# Patient Record
Sex: Female | Born: 1970 | Hispanic: Yes | Marital: Married | Smoking: Never smoker
Health system: Southern US, Community
[De-identification: ages and names within clinical notes are randomized; demographics above are authoritative.]

## PROBLEM LIST (undated history)

## (undated) DIAGNOSIS — M1990 Unspecified osteoarthritis, unspecified site: Secondary | ICD-10-CM

## (undated) DIAGNOSIS — E119 Type 2 diabetes mellitus without complications: Secondary | ICD-10-CM

## (undated) HISTORY — DX: Unspecified osteoarthritis, unspecified site: M19.90

## (undated) HISTORY — DX: Type 2 diabetes mellitus without complications: E11.9

---

## 2016-10-12 ENCOUNTER — Ambulatory Visit: Payer: Self-pay | Attending: Oncology

## 2016-10-12 ENCOUNTER — Ambulatory Visit
Admission: RE | Admit: 2016-10-12 | Discharge: 2016-10-12 | Disposition: A | Discharge status: Home / Self Care | Payer: Self-pay | Source: Ambulatory Visit | Attending: Oncology | Admitting: Oncology

## 2016-10-12 VITALS — BP 134/89 | HR 85 | Temp 98.2°F | Ht 61.0 in | Wt 166.0 lb

## 2016-10-12 DIAGNOSIS — Z0000 Encounter for general adult medical examination without abnormal findings: Secondary | ICD-10-CM

## 2016-10-12 NOTE — Progress Notes (Signed)
Subjective:     Patient ID: Harlon Flor, female   DOB: 27-Jul-1970, 46 y.o.   MRN: [id number]  HPI   Review of Systems     Objective:   Physical Exam  Pulmonary/Chest: Right breast exhibits no inverted nipple, no mass, no nipple discharge, no skin change and no tenderness. Left breast exhibits no inverted nipple, no mass, no nipple discharge, no skin change and no tenderness. Breasts are symmetrical.       Assessment:     46 year old hispanic patient presents for [facility] clinic visit.  Patient screened, and meets BCCCP eligibility.  Patient does not have insurance, Medicare or Medicaid.  Handout given on Affordable Care Act.  Instructed patient on breast self-exam using teach back method. CBE unremarkable.  No mass or lump palpated.  Maritza Afanador interpreted exam.  Patient's birthday 12-28-2070.      Plan:     Sent for bilateral screening mammogram.

## 2016-10-18 NOTE — Progress Notes (Signed)
Letter mailed from [facility] to notify of normal mammogram results.  Patient to return in one year for annual screening.  Copy to [facility]. 

## 2020-03-03 ENCOUNTER — Other Ambulatory Visit: Payer: Self-pay

## 2020-03-03 ENCOUNTER — Emergency Department
Admission: EM | Admit: 2020-03-03 | Discharge: 2020-03-03 | Disposition: A | Discharge status: Home / Self Care | Payer: Self-pay | Attending: Student in an Organized Health Care Education/Training Program | Admitting: Student in an Organized Health Care Education/Training Program

## 2020-03-03 ENCOUNTER — Emergency Department: Payer: Self-pay

## 2020-03-03 DIAGNOSIS — Z20822 Contact with and (suspected) exposure to covid-19: Secondary | ICD-10-CM | POA: Insufficient documentation

## 2020-03-03 DIAGNOSIS — R519 Headache, unspecified: Secondary | ICD-10-CM | POA: Insufficient documentation

## 2020-03-03 DIAGNOSIS — N3000 Acute cystitis without hematuria: Secondary | ICD-10-CM | POA: Insufficient documentation

## 2020-03-03 DIAGNOSIS — R531 Weakness: Secondary | ICD-10-CM | POA: Insufficient documentation

## 2020-03-03 LAB — URINALYSIS, COMPLETE (UACMP) WITH MICROSCOPIC
Bilirubin Urine: NEGATIVE
Glucose, UA: NEGATIVE mg/dL
Ketones, ur: NEGATIVE mg/dL
Nitrite: POSITIVE — AB
Protein, ur: NEGATIVE mg/dL
Specific Gravity, Urine: 1.009 (ref 1.005–1.030)
WBC, UA: >50 WBC/hpf — ABNORMAL HIGH (ref 0–5)
pH: 7 (ref 5.0–8.0)

## 2020-03-03 LAB — BASIC METABOLIC PANEL
Anion gap: 11 (ref 5–15)
BUN: 14 mg/dL (ref 6–20)
CO2: 23 mmol/L (ref 22–32)
Calcium: 9.3 mg/dL (ref 8.9–10.3)
Chloride: 103 mmol/L (ref 98–111)
Creatinine, Ser: 0.55 mg/dL (ref 0.44–1.00)
GFR, Estimated: >60 mL/min (ref >60)
Glucose, Bld: 126 mg/dL — ABNORMAL HIGH (ref 70–99)
Potassium: 4 mmol/L (ref 3.5–5.1)
Sodium: 137 mmol/L (ref 135–145)

## 2020-03-03 LAB — RESP PANEL BY RT-PCR (FLU A&B, COVID) ARPGX2
Influenza A by PCR: NEGATIVE
Influenza B by PCR: NEGATIVE
SARS Coronavirus 2 by RT PCR: NEGATIVE

## 2020-03-03 LAB — CBC
HCT: 33.5 % — ABNORMAL LOW (ref 36.0–46.0)
Hemoglobin: 10.6 g/dL — ABNORMAL LOW (ref 12.0–15.0)
MCH: 23.3 pg — ABNORMAL LOW (ref 26.0–34.0)
MCHC: 31.6 g/dL (ref 30.0–36.0)
MCV: 73.6 fL — ABNORMAL LOW (ref 80.0–100.0)
Platelets: 340 10*3/uL (ref 150–400)
RBC: 4.55 MIL/uL (ref 3.87–5.11)
RDW: 21.7 % — ABNORMAL HIGH (ref 11.5–15.5)
WBC: 11.1 10*3/uL — ABNORMAL HIGH (ref 4.0–10.5)
nRBC: 0 % (ref 0.0–0.2)

## 2020-03-03 LAB — POC SARS CORONAVIRUS 2 AG -  ED: SARS Coronavirus 2 Ag: NEGATIVE

## 2020-03-03 MED ORDER — DIPHENHYDRAMINE HCL 50 MG/ML IJ SOLN
12.5000 mg | Freq: Once | INTRAMUSCULAR | Status: AC
  Administered 2020-03-03: 12.5 mg via INTRAVENOUS
  Filled 2020-03-03: qty 1

## 2020-03-03 MED ORDER — SODIUM CHLORIDE 0.9 % IV SOLN
1.0000 g | Freq: Once | INTRAVENOUS | Status: AC
  Administered 2020-03-03: 1 g via INTRAVENOUS
  Filled 2020-03-03: qty 10

## 2020-03-03 MED ORDER — SODIUM CHLORIDE 0.9 % IV BOLUS
1000.0000 mL | Freq: Once | INTRAVENOUS | Status: AC
  Administered 2020-03-03: 1000 mL via INTRAVENOUS

## 2020-03-03 MED ORDER — KETOROLAC TROMETHAMINE 30 MG/ML IJ SOLN
15.0000 mg | Freq: Once | INTRAMUSCULAR | Status: AC
  Administered 2020-03-03: 15 mg via INTRAVENOUS
  Filled 2020-03-03: qty 1

## 2020-03-03 MED ORDER — PROCHLORPERAZINE EDISYLATE 10 MG/2ML IJ SOLN
10.0000 mg | Freq: Once | INTRAMUSCULAR | Status: AC
  Administered 2020-03-03: 10 mg via INTRAVENOUS
  Filled 2020-03-03: qty 2

## 2020-03-03 MED ORDER — ONDANSETRON 4 MG PO TBDP: 4.0000 mg | ORAL_TABLET | Freq: Three times a day (TID) | ORAL | 0 refills | Status: AC | PRN

## 2020-03-03 MED ORDER — CEPHALEXIN 500 MG PO CAPS: 500.0000 mg | ORAL_CAPSULE | Freq: Three times a day (TID) | ORAL | 0 refills | Status: AC

## 2020-03-03 NOTE — ED Provider Notes (Signed)
[facility] Emergency Department Provider Note    Event Date/Time   First MD Initiated Contact with Patient 03/03/20 [phone]     (approximate)  I have reviewed the triage vital signs and the nursing notes.   HISTORY  Chief Complaint Weakness    HPI Cassandra Webster is a 50 y.o. female with a history of headaches presents to the ER for evaluation of generalized malaise muscle aches backache as well as headache for the past week or so.  Not having cough or congestion.  No known sick contacts.  No measured fevers.  Feels generalized weakness.  Has been taking ibuprofen without improvement.  No neck stiffness.  No numbness or tingling.  Denies any dysuria.  No history of kidney stones or kidney infection.    History reviewed. No pertinent past medical history. No family history on file. History reviewed. No pertinent surgical history. There are no problems to display for this patient.     Prior to Admission medications   Medication Sig Start Date End Date Taking? Authorizing Provider  cephALEXin (KEFLEX) 500 MG capsule Take 1 capsule (500 mg total) by mouth 3 (three) times daily for 7 days. 03/03/20 03/10/20 Yes Merlyn Lot, MD  ondansetron (ZOFRAN ODT) 4 MG disintegrating tablet Take 1 tablet (4 mg total) by mouth every 8 (eight) hours as needed for nausea or vomiting. 03/03/20  Yes Merlyn Lot, MD    Allergies Patient has no allergy information on record.    Social History    Review of Systems Patient denies headaches, rhinorrhea, blurry vision, numbness, shortness of breath, chest pain, edema, cough, abdominal pain, nausea, vomiting, diarrhea, dysuria, fevers, rashes or hallucinations unless otherwise stated above in HPI. ____________________________________________   PHYSICAL EXAM:  VITAL SIGNS: Vitals:   03/03/20 0938 03/03/20 1100  BP: 126/82 116/72  Pulse: 90 82  Resp: 19 16  Temp: 99.2 F (37.3 C)   SpO2: 100% 98%     Constitutional: Alert and oriented.  Eyes: Conjunctivae are normal.  Head: Atraumatic. Nose: No congestion/rhinnorhea. Mouth/Throat: Mucous membranes are moist.   Neck: No stridor. Painless ROM.  Cardiovascular: Normal rate, regular rhythm. Grossly normal heart sounds.  Good peripheral circulation. Respiratory: Normal respiratory effort.  No retractions. Lungs CTAB. Gastrointestinal: Soft and nontender. No distention. No abdominal bruits. No CVA tenderness. Genitourinary:  Musculoskeletal: No lower extremity tenderness nor edema.  No joint effusions. Neurologic:  CN- intact.  No facial droop, Normal FNF.  Normal heel to shin.  Sensation intact bilaterally. Normal speech and language. No gross focal neurologic deficits are appreciated. No gait instability. Skin:  Skin is warm, dry and intact. No rash noted. Psychiatric: Mood and affect are normal. Speech and behavior are normal.  ____________________________________________   LABS (all labs ordered are listed, but only abnormal results are displayed)  Results for orders placed or performed during the hospital encounter of 03/03/20 (from the past 24 hour(s))  Basic metabolic panel     Status: Abnormal   Collection Time: 03/03/20  8:53 AM  Result Value Ref Range   Sodium 137 135 - 145 mmol/L   Potassium 4.0 3.5 - 5.1 mmol/L   Chloride 103 98 - 111 mmol/L   CO2 23 22 - 32 mmol/L   Glucose, Bld 126 (H) 70 - 99 mg/dL   BUN 14 6 - 20 mg/dL   Creatinine, Ser 0.55 0.44 - 1.00 mg/dL   Calcium 9.3 8.9 - 10.3 mg/dL   GFR, Estimated >60 >60 mL/min  Anion gap 11 5 - 15  CBC     Status: Abnormal   Collection Time: 03/03/20  8:53 AM  Result Value Ref Range   WBC 11.1 (H) 4.0 - 10.5 K/uL   RBC 4.55 3.87 - 5.11 MIL/uL   Hemoglobin 10.6 (L) 12.0 - 15.0 g/dL   HCT 33.5 (L) 36.0 - 46.0 %   MCV 73.6 (L) 80.0 - 100.0 fL   MCH 23.3 (L) 26.0 - 34.0 pg   MCHC 31.6 30.0 - 36.0 g/dL   RDW 21.7 (H) 11.5 - 15.5 %   Platelets 340 150 - 400 K/uL    nRBC 0.0 0.0 - 0.2 %  Urinalysis, Complete w Microscopic Urine, Clean Catch     Status: Abnormal   Collection Time: 03/03/20  9:55 AM  Result Value Ref Range   Color, Urine YELLOW (A) YELLOW   APPearance HAZY (A) CLEAR   Specific Gravity, Urine 1.009 1.005 - 1.030   pH 7.0 5.0 - 8.0   Glucose, UA NEGATIVE NEGATIVE mg/dL   Hgb urine dipstick SMALL (A) NEGATIVE   Bilirubin Urine NEGATIVE NEGATIVE   Ketones, ur NEGATIVE NEGATIVE mg/dL   Protein, ur NEGATIVE NEGATIVE mg/dL   Nitrite POSITIVE (A) NEGATIVE   Leukocytes,Ua LARGE (A) NEGATIVE   RBC / HPF 0-5 0 - 5 RBC/hpf   WBC, UA >50 (H) 0 - 5 WBC/hpf   Bacteria, UA MANY (A) NONE SEEN   Squamous Epithelial / LPF 0-5 0 - 5  Resp Panel by RT-PCR (Flu A&B, Covid) Nasopharyngeal Swab     Status: None   Collection Time: 03/03/20  9:55 AM   Specimen: Nasopharyngeal Swab; Nasopharyngeal(NP) swabs in vial transport medium  Result Value Ref Range   SARS Coronavirus 2 by RT PCR NEGATIVE NEGATIVE   Influenza A by PCR NEGATIVE NEGATIVE   Influenza B by PCR NEGATIVE NEGATIVE  POC SARS Coronavirus 2 Ag-ED - Nasal Swab (BD Veritor Kit)     Status: None   Collection Time: 03/03/20 10:33 AM  Result Value Ref Range   SARS Coronavirus 2 Ag NEGATIVE NEGATIVE   ____________________________________________  EKG My review and personal interpretation at Time: 8:47   Indication: weakness  Rate: 90  Rhythm: sinus Axis: normal Other: normal intervals, no stemi ____________________________________________  RADIOLOGY  I personally reviewed all radiographic images ordered to evaluate for the above acute complaints and reviewed radiology reports and findings.  These findings were personally discussed with the patient.  Please see medical record for radiology report.  ____________________________________________   PROCEDURES  Procedure(s) performed:  Procedures    Critical Care performed:  no ____________________________________________   INITIAL IMPRESSION / ASSESSMENT AND PLAN / ED COURSE  Pertinent labs & imaging results that were available during my care of the patient were reviewed by me and considered in my medical decision making (see chart for details).   DDX: Viral illness, dehydration, tension headache, migraine, mass, meningeal, CVA  Cassandra Webster is a 50 y.o. who presents to the ED with presentation as described above.  Patient nontoxic-appearing afebrile hemodynamically stable.  Generalized malaise symptoms without any focal neuro deficits.  Is not sudden onset no trauma.  She has no meningeal irritation.  This not consistent with meningitis or SAH.  CT head normal.  Is not consistent with CVA.  Will give IV fluids treat for migraine and reassess.  Possible urine tract symptoms will get a chest x-ray.  Will test for Covid.  Clinical Course as of 03/03/20 1205  Tue Mar 03, 2020  1042 Urine consistent with UTI.  I do suspect she has mild pyelonephritis.  No blood to suggest stone her symptoms simply more consistent with pyelonephritis.  Will give IV Rocephin and IV fluids and reassess. [PR]  1147 Patient feeling much improved.  Is tolerating p.o. She appears appropriate for for outpatient  management and follow-up. [PR]    Clinical Course User Index [PR] Merlyn Lot, MD    The patient was evaluated in Emergency Department today for the symptoms described in the history of present illness. He/she was evaluated in the context of the global COVID-19 pandemic, which necessitated consideration that the patient might be at risk for infection with the SARS-CoV-2 virus that causes COVID-19. Institutional protocols and algorithms that pertain to the evaluation of patients at risk for COVID-19 are in a state of rapid change based on information released by regulatory bodies including the CDC and federal and state organizations. These policies and algorithms  were followed during the patient's care in the ED.  As part of my medical decision making, I reviewed the following data within the [place] notes reviewed and incorporated, Labs reviewed, notes from prior ED visits and [place] Controlled Substance Database   ____________________________________________   FINAL CLINICAL IMPRESSION(S) / ED DIAGNOSES  Final diagnoses:  Generalized weakness  Acute cystitis without hematuria      NEW MEDICATIONS STARTED DURING THIS VISIT:  New Prescriptions   CEPHALEXIN (KEFLEX) 500 MG CAPSULE    Take 1 capsule (500 mg total) by mouth 3 (three) times daily for 7 days.   ONDANSETRON (ZOFRAN ODT) 4 MG DISINTEGRATING TABLET    Take 1 tablet (4 mg total) by mouth every 8 (eight) hours as needed for nausea or vomiting.     Note:  This document was prepared using Dragon voice recognition software and may include unintentional dictation errors.    Merlyn Lot, MD 03/03/20 [phone]

## 2020-03-03 NOTE — ED Triage Notes (Addendum)
Pt comes via POV from home with c/o severe headache and increased weakness. Pt states pain to head since last Friday. Pt states she feels achy all over and hurts in her bones.  Pt states she feels so weak she can barely walk and get around.  Pt states some blurry vision from yesterday because her head hurt so much. Pt states some nausea. Pt family states in past she had a hernia or something similar in back of neck area. Family unsure if that is what is going on.

## 2020-03-28 ENCOUNTER — Encounter: Payer: Self-pay | Admitting: *Deleted

## 2020-04-22 ENCOUNTER — Ambulatory Visit
Admission: RE | Admit: 2020-04-22 | Discharge: 2020-04-22 | Disposition: A | Discharge status: Home / Self Care | Payer: Self-pay | Source: Ambulatory Visit | Attending: Oncology | Admitting: Oncology

## 2020-04-22 ENCOUNTER — Ambulatory Visit: Payer: Self-pay | Attending: Oncology

## 2020-04-22 ENCOUNTER — Other Ambulatory Visit: Payer: Self-pay

## 2020-04-22 VITALS — BP 122/85 | HR 76 | Temp 97.3°F | Ht 68.0 in | Wt 155.3 lb

## 2020-04-22 DIAGNOSIS — Z0000 Encounter for general adult medical examination without abnormal findings: Secondary | ICD-10-CM | POA: Insufficient documentation

## 2020-04-22 NOTE — Progress Notes (Signed)
  Subjective:     Patient ID: Harlon Flor, female   DOB: 05-02-1970, 50 y.o.   MRN: [id number]  HPI   Review of Systems     Objective:   Physical Exam Chest:  Breasts:     Right: No swelling, bleeding, inverted nipple, mass, nipple discharge, skin change or tenderness.     Left: No swelling, bleeding, inverted nipple, mass, nipple discharge, skin change or tenderness.          Assessment:     50 year old patient presents for [facility] clinic visit.  Patient's daughter present for exam.  Patient screened, and meets B[facility]CP eligibility.  Patient does not have insurance, Medicare or Medicaid.  Instructed patient on breast self awareness using teach back method.  Clinical breast exam unremarkable. No mass or lump palpated.   Risk Assessment    Risk Scores      04/22/2020   Last edited by: Jim Like, RN   5-year risk: 0.5 %   Lifetime risk: 4.2 %             Plan:     Sent for bilateral screening mammogram.

## 2020-05-06 NOTE — Progress Notes (Signed)
Letter mailed from [facility] to notify of normal mammogram results.  Patient to return in one year for annual screening.  Copy to [facility]. 

## 2020-05-28 ENCOUNTER — Emergency Department: Payer: Self-pay

## 2020-05-28 ENCOUNTER — Other Ambulatory Visit: Payer: Self-pay

## 2020-05-28 ENCOUNTER — Emergency Department
Admission: EM | Admit: 2020-05-28 | Discharge: 2020-05-28 | Disposition: A | Discharge status: Home / Self Care | Payer: Self-pay | Attending: Emergency Medicine | Admitting: Emergency Medicine

## 2020-05-28 DIAGNOSIS — Z28310 Unvaccinated for covid-19: Secondary | ICD-10-CM | POA: Insufficient documentation

## 2020-05-28 DIAGNOSIS — U071 COVID-19: Secondary | ICD-10-CM | POA: Insufficient documentation

## 2020-05-28 DIAGNOSIS — M791 Myalgia, unspecified site: Secondary | ICD-10-CM

## 2020-05-28 DIAGNOSIS — R5081 Fever presenting with conditions classified elsewhere: Secondary | ICD-10-CM

## 2020-05-28 LAB — URINALYSIS, COMPLETE (UACMP) WITH MICROSCOPIC
Bilirubin Urine: NEGATIVE
Glucose, UA: NEGATIVE mg/dL
Ketones, ur: NEGATIVE mg/dL
Leukocytes,Ua: NEGATIVE
Nitrite: NEGATIVE
Protein, ur: NEGATIVE mg/dL
Specific Gravity, Urine: 1.015 (ref 1.005–1.030)
pH: 6 (ref 5.0–8.0)

## 2020-05-28 LAB — BASIC METABOLIC PANEL
Anion gap: 11 (ref 5–15)
BUN: 12 mg/dL (ref 6–20)
CO2: 23 mmol/L (ref 22–32)
Calcium: 9.1 mg/dL (ref 8.9–10.3)
Chloride: 102 mmol/L (ref 98–111)
Creatinine, Ser: 0.64 mg/dL (ref 0.44–1.00)
GFR, Estimated: >60 mL/min (ref >60)
Glucose, Bld: 114 mg/dL — ABNORMAL HIGH (ref 70–99)
Potassium: 4 mmol/L (ref 3.5–5.1)
Sodium: 136 mmol/L (ref 135–145)

## 2020-05-28 LAB — CBC
HCT: 39.7 % (ref 36.0–46.0)
Hemoglobin: 13 g/dL (ref 12.0–15.0)
MCH: 27.1 pg (ref 26.0–34.0)
MCHC: 32.7 g/dL (ref 30.0–36.0)
MCV: 82.9 fL (ref 80.0–100.0)
Platelets: 305 10*3/uL (ref 150–400)
RBC: 4.79 MIL/uL (ref 3.87–5.11)
RDW: 17.8 % — ABNORMAL HIGH (ref 11.5–15.5)
WBC: 6.8 10*3/uL (ref 4.0–10.5)
nRBC: 0 % (ref 0.0–0.2)

## 2020-05-28 LAB — RESP PANEL BY RT-PCR (FLU A&B, COVID) ARPGX2
Influenza A by PCR: NEGATIVE
Influenza B by PCR: NEGATIVE
SARS Coronavirus 2 by RT PCR: POSITIVE — AB

## 2020-05-28 LAB — LACTIC ACID, PLASMA
Lactic Acid, Venous: 2.4 mmol/L (ref 0.5–1.9)
Lactic Acid, Venous: 1.4 mmol/L (ref 0.5–1.9)

## 2020-05-28 MED ORDER — LACTATED RINGERS IV BOLUS
1000.0000 mL | Freq: Once | INTRAVENOUS | Status: AC
  Administered 2020-05-28: 1000 mL via INTRAVENOUS

## 2020-05-28 MED ORDER — NIRMATRELVIR/RITONAVIR (PAXLOVID)TABLET
3.0000 | ORAL_TABLET | Freq: Once | ORAL | Status: DC
  Filled 2020-05-28: qty 30

## 2020-05-28 MED ORDER — KETOROLAC TROMETHAMINE 30 MG/ML IJ SOLN
15.0000 mg | Freq: Once | INTRAMUSCULAR | Status: AC
  Administered 2020-05-28: 15 mg via INTRAVENOUS
  Filled 2020-05-28: qty 1

## 2020-05-28 MED ORDER — ACETAMINOPHEN 325 MG PO TABS
650.0000 mg | ORAL_TABLET | Freq: Once | ORAL | Status: AC | PRN
  Administered 2020-05-28: 650 mg via ORAL
  Filled 2020-05-28: qty 2

## 2020-05-28 MED ORDER — NIRMATRELVIR/RITONAVIR (PAXLOVID)TABLET: 3.0000 | ORAL_TABLET | Freq: Two times a day (BID) | ORAL | 0 refills | Status: AC

## 2020-05-28 NOTE — ED Provider Notes (Signed)
[facility] Emergency Department Provider Note ____________________________________________   Event Date/Time   First MD Initiated Contact with Patient 05/28/20 1110     (approximate)  I have reviewed the triage vital signs and the nursing notes.  HISTORY  Chief Complaint Flank Pain   HPI Cassandra Webster is a 50 y.o. femalewho presents to the ED for evaluation of fever, myalgias.   Chart review indicates no relevant history.  Patient is to the ED, accompanied by her daughter who provides some additional history, for evaluation of 2 days of fever, myalgias bilateral atraumatic flank/back pain.  Patient reports that she is not vaccinated for COVID-19, and has had no known sick contacts.  In person Spanish interpreter utilized for history and physical.  Patient reports about 2 weeks of sore throat, that has improved, but now reports primarily 2 days of fevers and myalgias.  Denies chest pain and shortness of breath, but does report nonproductive cough and decreased p.o. intake.  Denies any sick contacts at home, but does report regularly going to [place] and the laundromat.  She reports pain all over, primarily to her bilateral hips and bilateral flanks and lower back.  Denies any falls or trauma.  Denies syncope.  Denies dysuria, diarrhea or stool changes, denies emesis.   History reviewed. No pertinent past medical history.  There are no problems to display for this patient.   History reviewed. No pertinent surgical history.  Prior to Admission medications   Medication Sig Start Date End Date Taking? Authorizing Provider  nirmatrelvir/ritonavir EUA (PAXLOVID) TABS Take 3 tablets by mouth 2 (two) times daily for 5 days. Patient GFR is >60. Take nirmatrelvir (150 mg) two tablets twice daily for 5 days and ritonavir (100 mg) one tablet twice daily for 5 days. 05/28/20 06/02/20 Yes Delton Prairie, MD  ondansetron (ZOFRAN ODT) 4 MG disintegrating  tablet Take 1 tablet (4 mg total) by mouth every 8 (eight) hours as needed for nausea or vomiting. 03/03/20   Willy Eddy, MD    Allergies Patient has no known allergies.  No family history on file.  Social History    Review of Systems  Constitutional: Positive for fever/chills Eyes: No visual changes. ENT: Positive for sore throat, resolving. Cardiovascular: Denies chest pain. Respiratory: Denies shortness of breath. Gastrointestinal: No abdominal pain.  No nausea, no vomiting.  No diarrhea.  No constipation. Genitourinary: Negative for dysuria. Musculoskeletal: Positive for myalgias Skin: Negative for rash. Neurological: Negative for  focal weakness or numbness.  ____________________________________________   PHYSICAL EXAM:  VITAL SIGNS: Vitals:   05/28/20 1341 05/28/20 1342  BP:    Pulse: 86   Resp:  18  Temp:  99.5 F (37.5 C)  SpO2: 99%     Constitutional: Alert and oriented. Well appearing and in no acute distress. Eyes: Conjunctivae are normal. PERRL. EOMI. Head: Atraumatic. Nose: No congestion/rhinnorhea. Mouth/Throat: Mucous membranes are dry.  Oropharynx non-erythematous. Uvula is midline and tonsils are 1+ bilaterally. Neck: No stridor. No cervical spine tenderness to palpation. Cardiovascular: Tachycardic rate, regular rhythm. Grossly normal heart sounds.  Good peripheral circulation. Respiratory: Normal respiratory effort.  No retractions. Lungs CTAB. Gastrointestinal: Soft , nondistended, nontender to palpation. No CVA tenderness. Musculoskeletal: No lower extremity tenderness nor edema.  No joint effusions. No signs of acute trauma. Neurologic:  Normal speech and language. No gross focal neurologic deficits are appreciated. No gait instability noted. Skin:  Skin is warm, dry and intact. No rash noted. Psychiatric: Mood and affect are  normal. Speech and behavior are normal. ____________________________________________   LABS (all labs ordered  are listed, but only abnormal results are displayed)  Labs Reviewed  RESP PANEL BY RT-PCR (FLU A&B, COVID) ARPGX2 - Abnormal; Notable for the following components:      Result Value   SARS Coronavirus 2 by RT PCR POSITIVE (*)    All other components within normal limits  URINALYSIS, COMPLETE (UACMP) WITH MICROSCOPIC - Abnormal; Notable for the following components:   Color, Urine YELLOW (*)    APPearance CLEAR (*)    Hgb urine dipstick SMALL (*)    Bacteria, UA RARE (*)    All other components within normal limits  BASIC METABOLIC PANEL - Abnormal; Notable for the following components:   Glucose, Bld 114 (*)    All other components within normal limits  CBC - Abnormal; Notable for the following components:   RDW 17.8 (*)    All other components within normal limits  LACTIC ACID, PLASMA - Abnormal; Notable for the following components:   Lactic Acid, Venous 2.4 (*)    All other components within normal limits  LACTIC ACID, PLASMA   ____________________________________________  12 Lead EKG  Sinus rhythm, rate of 83 bpm.  Normal axis and intervals.  No evidence of acute ischemia. ____________________________________________  RADIOLOGY  ED MD interpretation: 2 view CXR reviewed by me without evidence of acute cardiopulmonary pathology. CT renal study reviewed by me without evidence of ureterolithiasis or urologic obstruction  Official radiology report(s): DG Chest 2 View  Result Date: 05/28/2020 CLINICAL DATA:  Lower lobe infiltrate. EXAM: CHEST - 2 VIEW COMPARISON:  None. FINDINGS: The heart size and mediastinal contours are within normal limits. Both lungs are clear. The visualized skeletal structures are unremarkable. IMPRESSION: No active cardiopulmonary disease. Electronically Signed   By: Lupita Raider M.D.   On: 05/28/2020 11:20   CT Renal Stone Study  Result Date: 05/28/2020 CLINICAL DATA:  Acute flank pain. EXAM: CT ABDOMEN AND PELVIS WITHOUT CONTRAST TECHNIQUE:  Multidetector CT imaging of the abdomen and pelvis was performed following the standard protocol without IV contrast. COMPARISON:  None. FINDINGS: Lower chest: No acute abnormality. Hepatobiliary: No focal liver abnormality is seen. No gallstones, gallbladder wall thickening, or biliary dilatation. Pancreas: Unremarkable. No pancreatic ductal dilatation or surrounding inflammatory changes. Spleen: Normal in size without focal abnormality. Adrenals/Urinary Tract: Adrenal glands are unremarkable. Kidneys are normal, without renal calculi, focal lesion, or hydronephrosis. Bladder is unremarkable. Stomach/Bowel: Stomach is within normal limits. Appendix appears normal. No evidence of bowel wall thickening, distention, or inflammatory changes. Vascular/Lymphatic: No significant vascular findings are present. No enlarged abdominal or pelvic lymph nodes. Reproductive: Uterus and bilateral adnexa are unremarkable. Other: No abdominal wall hernia or abnormality. No abdominopelvic ascites. Musculoskeletal: No acute or significant osseous findings. IMPRESSION: No abnormality seen in the abdomen or pelvis. Electronically Signed   By: Lupita Raider M.D.   On: 05/28/2020 11:27    ____________________________________________   PROCEDURES and INTERVENTIONS  Procedure(s) performed (including Critical Care):  .1-3 Lead EKG Interpretation Performed by: Delton Prairie, MD Authorized by: Delton Prairie, MD     Interpretation: abnormal     ECG rate:  106   ECG rate assessment: tachycardic     Rhythm: sinus tachycardia     Ectopy: none     Conduction: normal   Comments:     Prior to fluids    Medications  nirmatrelvir/ritonavir EUA (PAXLOVID) TABS 3 tablet (has no administration in time range)  acetaminophen (TYLENOL) tablet 650 mg (650 mg Oral Given 05/28/20 0907)  lactated ringers bolus 1,000 mL (0 mLs Intravenous Stopped 05/28/20 1342)  lactated ringers bolus 1,000 mL (1,000 mLs Intravenous New Bag/Given  05/28/20 1354)  ketorolac (TORADOL) 30 MG/ML injection 15 mg (15 mg Intravenous Given 05/28/20 1232)    ____________________________________________   MDM / ED COURSE   Unvaccinated 50 year old woman presents to the ED with fevers and myalgias with evidence of COVID-19 and amenable to outpatient management with initiation of baclofen.  Presents febrile and tachycardic, but remains hemodynamically stable throughout.  Vitals normalized with fluid resuscitation and antipyretics.  Exam is reassuring without evidence of distress, tachypnea, neurologic or vascular deficits.  She looks well clinically, just appears uncomfortable.  Blood work shows a mild lactic acidosis, for which she received IV fluids and this resolved on recheck.  No leukocytosis, signs of renal dysfunction.  With how well she looks clinically, her clear urine, CXR and CT abdomen/pelvis, I see no evidence of sepsis or further derangements beyond COVID-19.  She looks well on recheck and we discussed outpatient management of COVID-19 with Paxil of it, and she is agreeable.  Return precautions for the ED were discussed.  Clinical Course as of 05/28/20 1415  Thu May 28, 2020  1408 Reassessed.  Patient resting comfortably with normal vital signs on room air.  Not tachypneic.  Discussed with patient and her daughter diagnosis of COVID-19.  We discussed Paxlovid, and they are agreeable with starting this.  We discussed management at home and return precautions for the ED. [DS]    Clinical Course User Index [DS] Delton Prairie, MD    ____________________________________________   FINAL CLINICAL IMPRESSION(S) / ED DIAGNOSES  Final diagnoses:  COVID-19  Fever in other diseases  Myalgia     ED Discharge Orders         Ordered    nirmatrelvir/ritonavir EUA (PAXLOVID) TABS  2 times daily        05/28/20 1411           Karol Skarzynski   Note:  This document was prepared using Dragon voice recognition software and may include  unintentional dictation errors.   Delton Prairie, MD 05/28/20 [phone]

## 2020-05-28 NOTE — ED Triage Notes (Signed)
Pt to ED for bilateral flank pain that started worsening yesterday, recently treated for kidney infection.  Able to void with no pain, reports foul smell.  Pt appears uncomfortable in triage.

## 2020-05-28 NOTE — Discharge Instructions (Addendum)
Please take Tylenol and ibuprofen/Advil for your pain.  It is safe to take them together, or to alternate them every few hours.  Take up to 1000mg  of Tylenol at a time, up to 4 times per day.  Do not take more than 4000 mg of Tylenol in 24 hours.  For ibuprofen, take 400-600 mg, 4-5 times per day.  You are being discharged with a prescription for Paxlovid, an anti-viral medicine that helps reduce COVID symptoms and hopefully get you well sooner. This prescription should be waiting for you at the [facility] pharmacy.   If you develop any worsening symptoms despite these measures, severe difficulty breathing, please return to the ED.

## 2021-04-14 ENCOUNTER — Other Ambulatory Visit: Payer: Self-pay

## 2021-04-14 DIAGNOSIS — Z1231 Encounter for screening mammogram for malignant neoplasm of breast: Secondary | ICD-10-CM

## 2021-07-14 ENCOUNTER — Ambulatory Visit
Admission: RE | Admit: 2021-07-14 | Discharge: 2021-07-14 | Disposition: A | Discharge status: Home / Self Care | Payer: Self-pay | Source: Ambulatory Visit | Attending: Obstetrics and Gynecology | Admitting: Obstetrics and Gynecology

## 2021-07-14 ENCOUNTER — Other Ambulatory Visit: Payer: Self-pay

## 2021-07-14 ENCOUNTER — Ambulatory Visit: Payer: Self-pay | Attending: Hematology and Oncology | Admitting: *Deleted

## 2021-07-14 VITALS — BP 131/88 | Wt 161.4 lb

## 2021-07-14 DIAGNOSIS — Z1231 Encounter for screening mammogram for malignant neoplasm of breast: Secondary | ICD-10-CM

## 2021-07-14 DIAGNOSIS — Z1239 Encounter for other screening for malignant neoplasm of breast: Secondary | ICD-10-CM

## 2021-07-14 NOTE — Patient Instructions (Signed)
Explained breast self awareness with Cassandra Webster. Patient did not need a Pap smear today due to last Pap smear and HPV typing was 02/18/2020. Let her know [facility] will cover Pap smears and HPV typing every 5 years unless has a history of abnormal Pap smears. Referred patient to the [facility] for a screening mammogram. Appointment scheduled Wednesday, July 14, 2021 at 1200. Patient aware of appointment and will be there. Let patient know [name] will follow up with her within the next couple weeks with results of her mammogram by letter or phone. Cassandra Webster verbalized understanding.  [name], [name], RN 11:43 AM

## 2021-07-14 NOTE — Progress Notes (Signed)
[name] is a 51 y.o. female who presents to [facility] clinic today with no complaints.    Pap Smear: Pap smear not completed today. Last Pap smear was 02/18/2020 at [facility] clinic and was normal with negative HPV. Per patient has no history of an abnormal Pap smear. Last Pap smear result is available in Epic.   Physical exam: Breasts Breasts symmetrical. No skin abnormalities bilateral breasts. No nipple retraction bilateral breasts. No nipple discharge bilateral breasts. No lymphadenopathy. No lumps palpated bilateral breasts. No complaints of pain or tenderness on exam.  MS DIGITAL SCREENING TOMO BILATERAL  Result Date: 05/01/2020 CLINICAL DATA:  Screening. EXAM: DIGITAL SCREENING BILATERAL MAMMOGRAM WITH TOMOSYNTHESIS AND CAD TECHNIQUE: Bilateral screening digital craniocaudal and mediolateral oblique mammograms were obtained. Bilateral screening digital breast tomosynthesis was performed. The images were evaluated with computer-aided detection. COMPARISON:  Previous exam(s). ACR Breast Density Category b: There are scattered areas of fibroglandular density. FINDINGS: There are no findings suspicious for malignancy. The images were evaluated with computer-aided detection. IMPRESSION: No mammographic evidence of malignancy. A result letter of this screening mammogram will be mailed directly to the patient. RECOMMENDATION: Screening mammogram in one year. (Code:SM-B-01Y) BI-RADS CATEGORY  1: Negative. Electronically Signed   By: Meda Klinefelter MD   On: 05/01/2020 16:21   MS DIGITAL SCREENING TOMO BILATERAL  Result Date: 10/13/2016 CLINICAL DATA:  Screening. EXAM: 2D DIGITAL SCREENING BILATERAL MAMMOGRAM WITH CAD AND ADJUNCT TOMO COMPARISON:  None. ACR Breast Density Category b: There are scattered areas of fibroglandular density. FINDINGS: There are no findings suspicious for malignancy. Images were processed with CAD. IMPRESSION: No mammographic evidence of malignancy. A  result letter of this screening mammogram will be mailed directly to the patient. RECOMMENDATION: Screening mammogram in one year. (Code:SM-B-01Y) BI-RADS CATEGORY  1: Negative. Electronically Signed   By: Baird Lyons M.D.   On: 10/13/2016 09:21        Pelvic/Bimanual Pap is not indicated today per BCCCP guidelines.   Smoking History: Patient has never smoked.   Patient Navigation: Patient education provided. Access to services provided for patient through [employer] program. Spanish interpreter Delos Haring from [facility] provided.   Colorectal Cancer Screening: Per patient has never had colonoscopy completed. FIT Test given to patient to complete. No complaints today.    Breast and Cervical Cancer Risk Assessment: Patient does not have family history of breast cancer, known genetic mutations, or radiation treatment to the chest before age 75. Patient does not have history of cervical dysplasia, immunocompromised, or DES exposure in-utero.  Risk Assessment     Risk Scores       07/14/2021 04/22/2020   Last edited by: Narda Rutherford, LPN Jim Like, RN   5-year risk: 0.5 % 0.5 %   Lifetime risk: 4.1 % 4.2 %            A: BCCCP exam without pap smear No complaints.  P: Referred patient to the [facility] for a screening mammogram. Appointment scheduled Wednesday, July 14, 2021 at 1200.  Priscille Heidelberg, RN 07/14/2021 11:43 AM

## 2022-02-01 IMAGING — CT CT RENAL STONE PROTOCOL
2 of 4 series · 16 of 46 positions shown, 18 images · non-contrast
Comparison: None.

CLINICAL DATA: Acute flank pain.

EXAM:
CT ABDOMEN AND PELVIS WITHOUT CONTRAST
TECHNIQUE: Multidetector CT imaging of the abdomen and pelvis was performed
following the standard protocol without IV contrast.

[Series 2: stone full standard · axial · 0.66mm/px · z∈[-546,-100]mm · 13 of 99 slices shown, 15 images]
[im 5/99  soft-tissue]
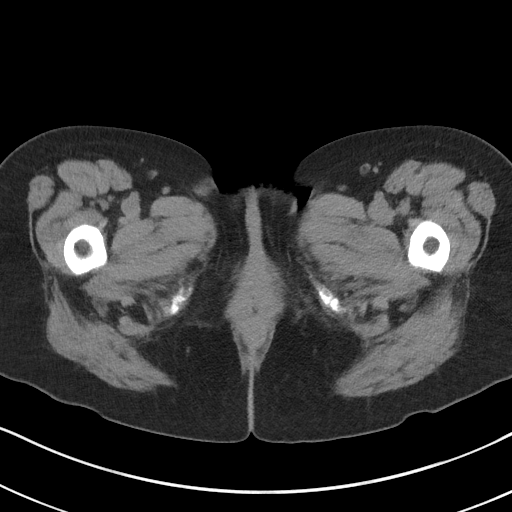
[im 5/99  bone]
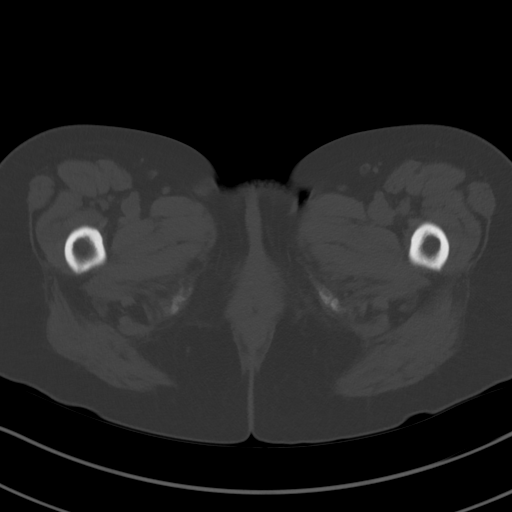
[im 13/99  soft-tissue]
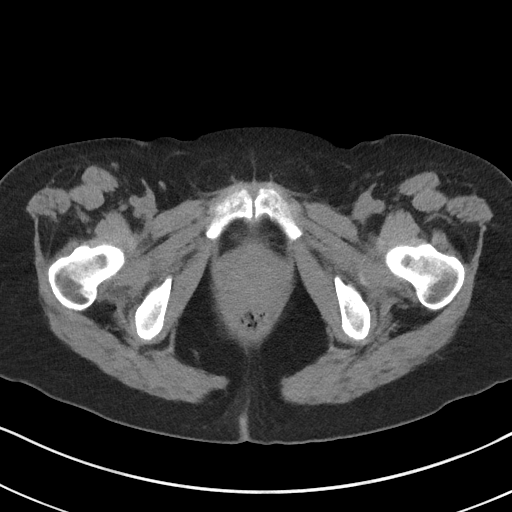
[im 22/99  soft-tissue]
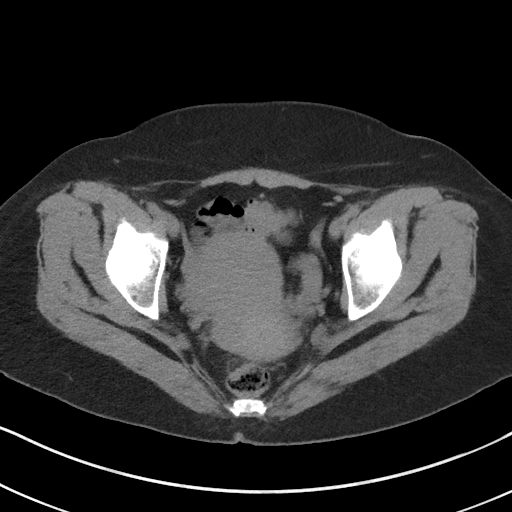
[im 26/99  soft-tissue]
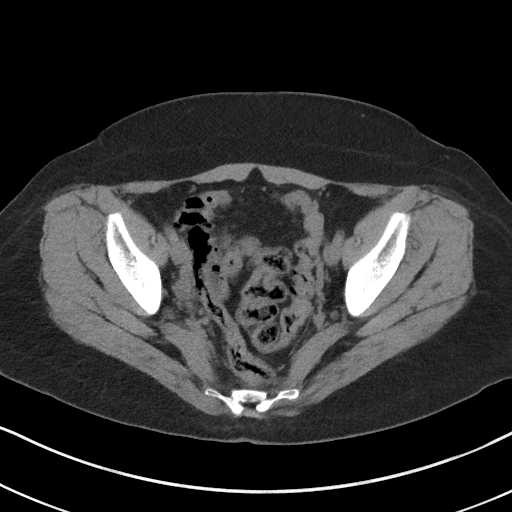
[im 35/99  soft-tissue]
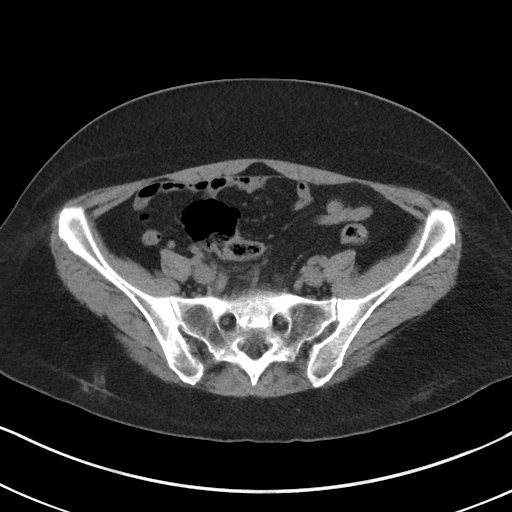
[im 43/99  soft-tissue]
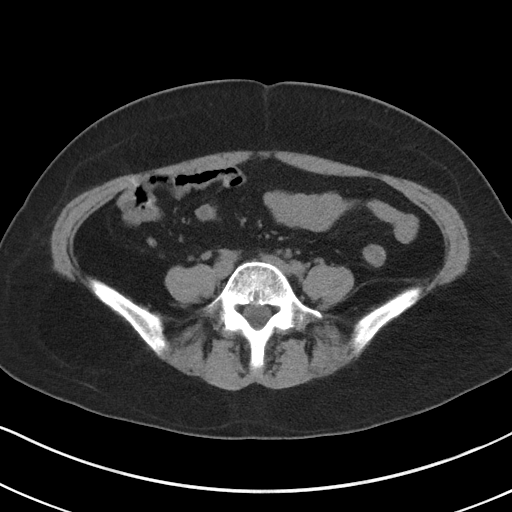
[im 52/99  soft-tissue]
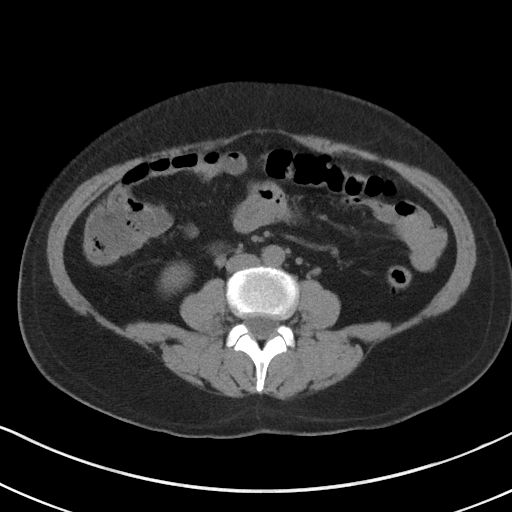
[im 56/99  soft-tissue]
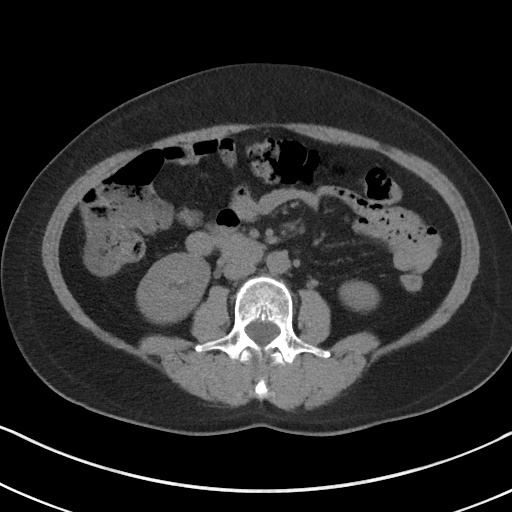
[im 64/99  soft-tissue]
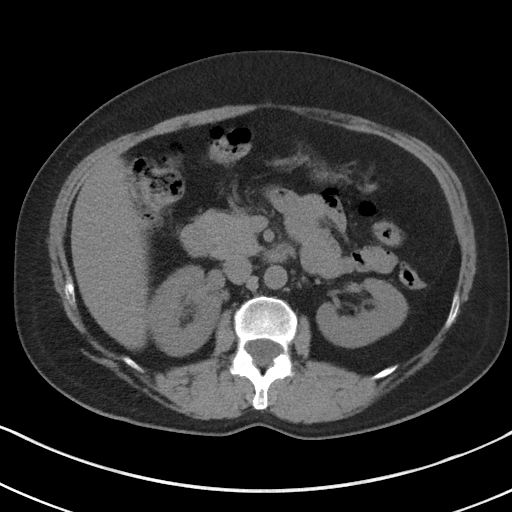
[im 64/99  bone]
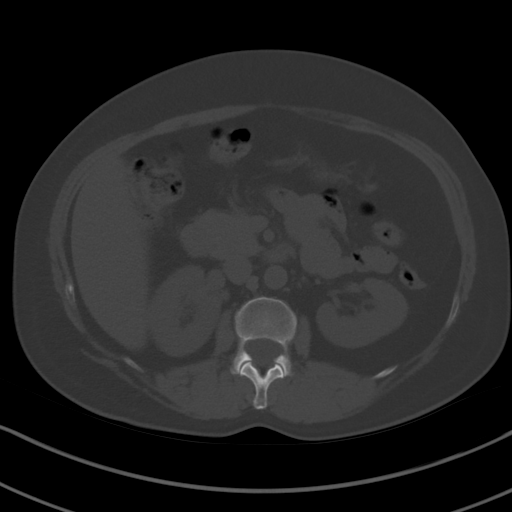
[im 73/99  soft-tissue]
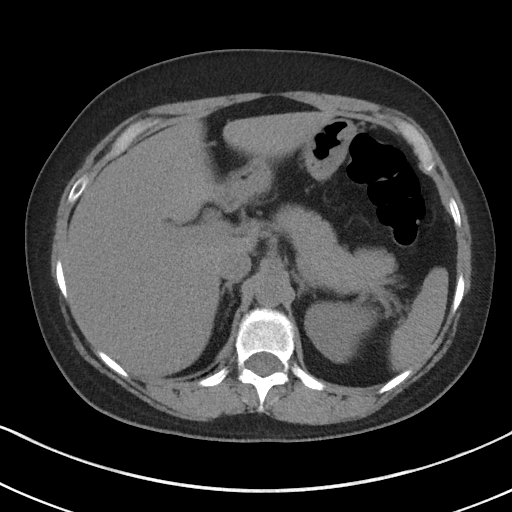
[im 77/99  soft-tissue]
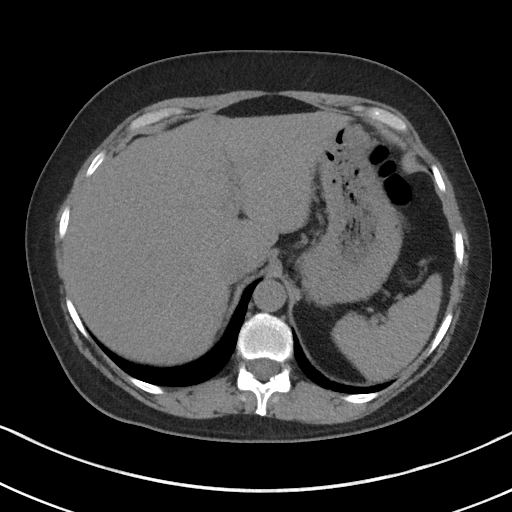
[im 86/99  soft-tissue]
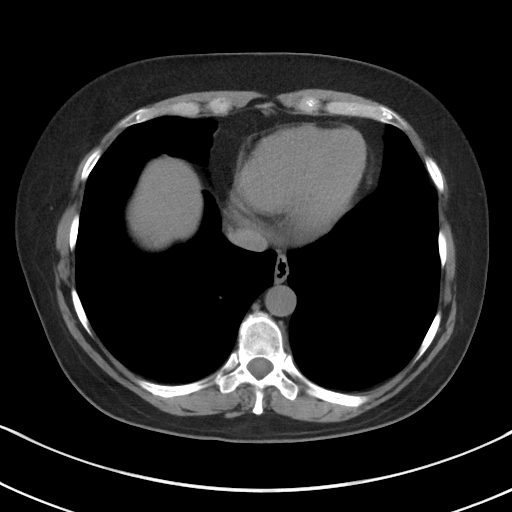
[im 94/99  soft-tissue]
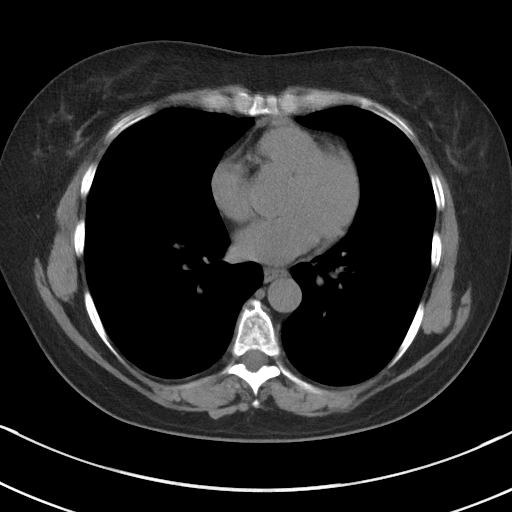

[Series 5: coronal · coronal · 0.77mm/px · 3 of 129 slices shown]
[im 43/129  soft-tissue]
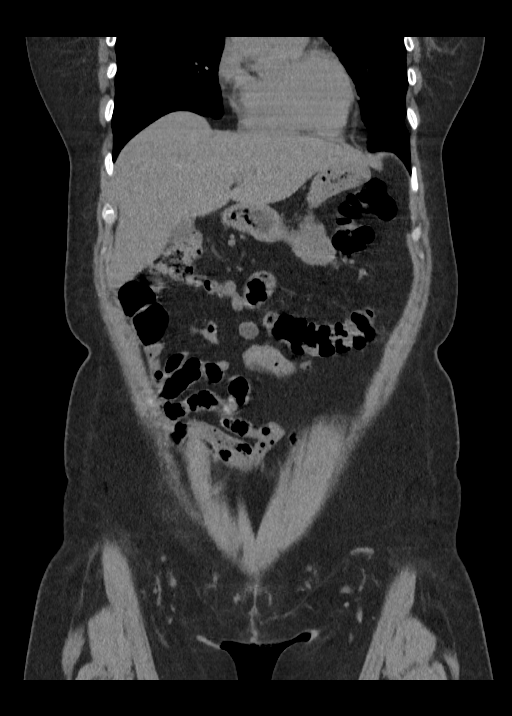
[im 57/129  soft-tissue]
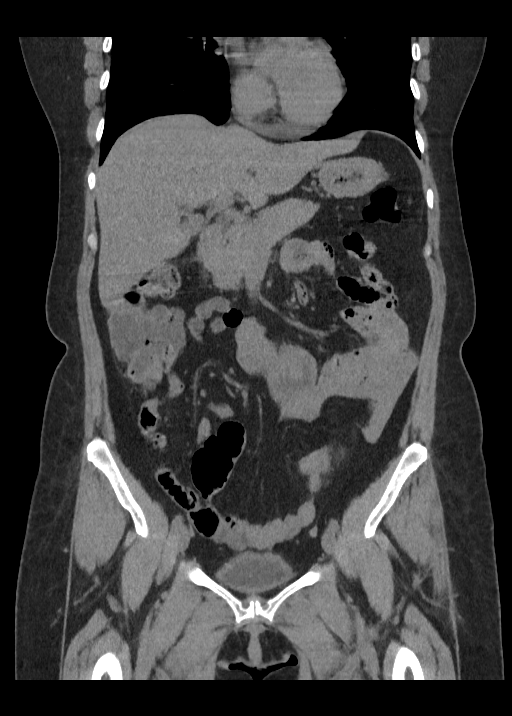
[im 72/129  soft-tissue]
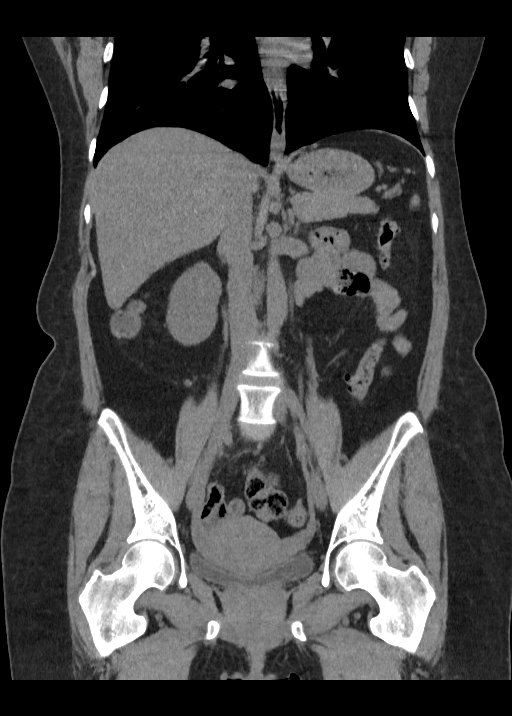

[16 of 46 positions shown; findings below may reference images not displayed]

FINDINGS: Lower chest: No acute abnormality.

Hepatobiliary: No focal liver abnormality is seen. No gallstones,
gallbladder wall thickening, or biliary dilatation.

Pancreas: Unremarkable. No pancreatic ductal dilatation or
surrounding inflammatory changes.

Spleen: Normal in size without focal abnormality.

Adrenals/Urinary Tract: Adrenal glands are unremarkable. Kidneys are
normal, without renal calculi, focal lesion, or hydronephrosis.
Bladder is unremarkable.

Stomach/Bowel: Stomach is within normal limits. Appendix appears
normal. No evidence of bowel wall thickening, distention, or
inflammatory changes.

Vascular/Lymphatic: No significant vascular findings are present. No
enlarged abdominal or pelvic lymph nodes.

Reproductive: Uterus and bilateral adnexa are unremarkable.

Other: No abdominal wall hernia or abnormality. No abdominopelvic
ascites.

Musculoskeletal: No acute or significant osseous findings.
IMPRESSION: No abnormality seen in the abdomen or pelvis.

## 2022-02-01 IMAGING — CR DG CHEST 2V
2 series · 2 of 2 positions shown · non-contrast
Comparison: None.

CLINICAL DATA: Lower lobe infiltrate.

EXAM:
CHEST - 2 VIEW

[chest pa]
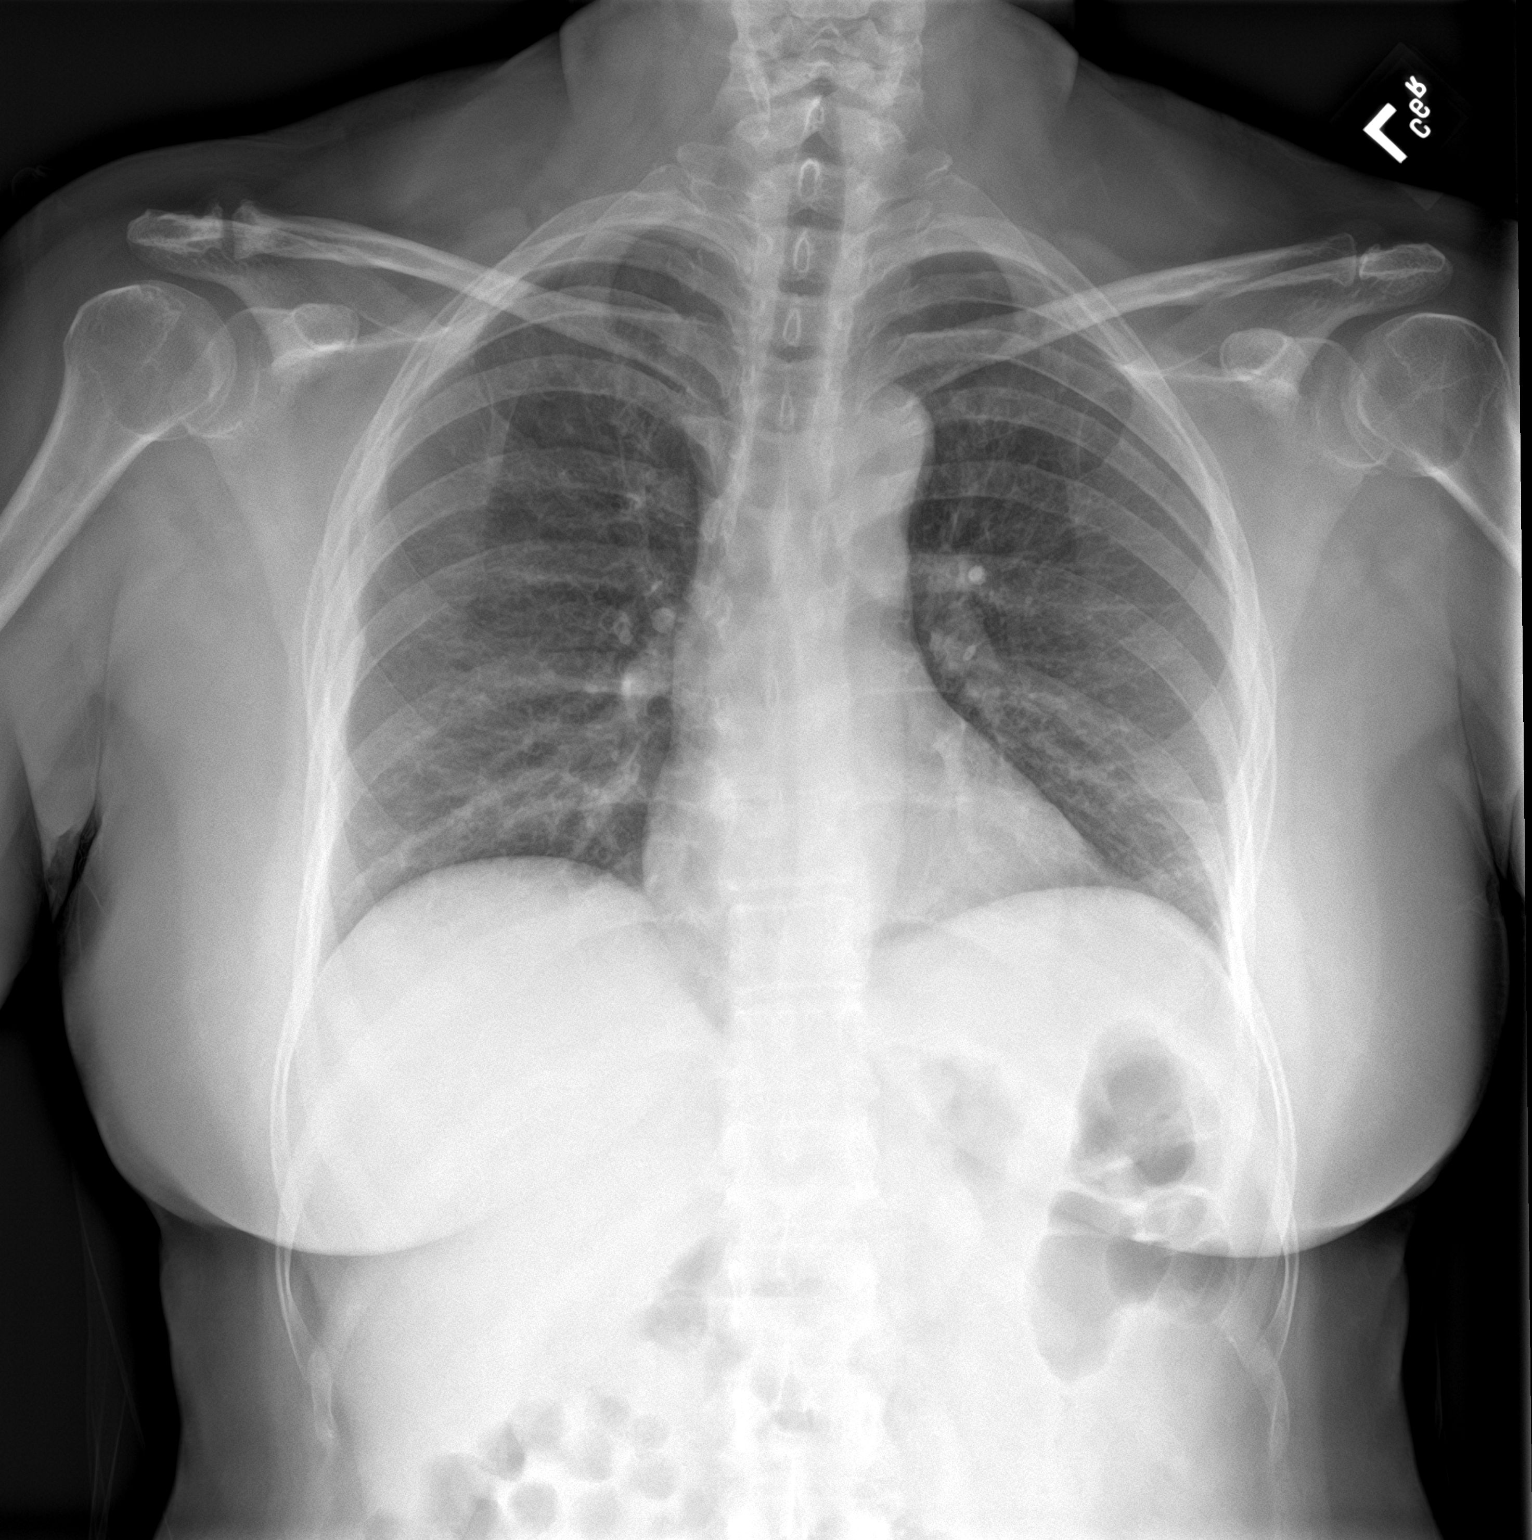

[chest lat]
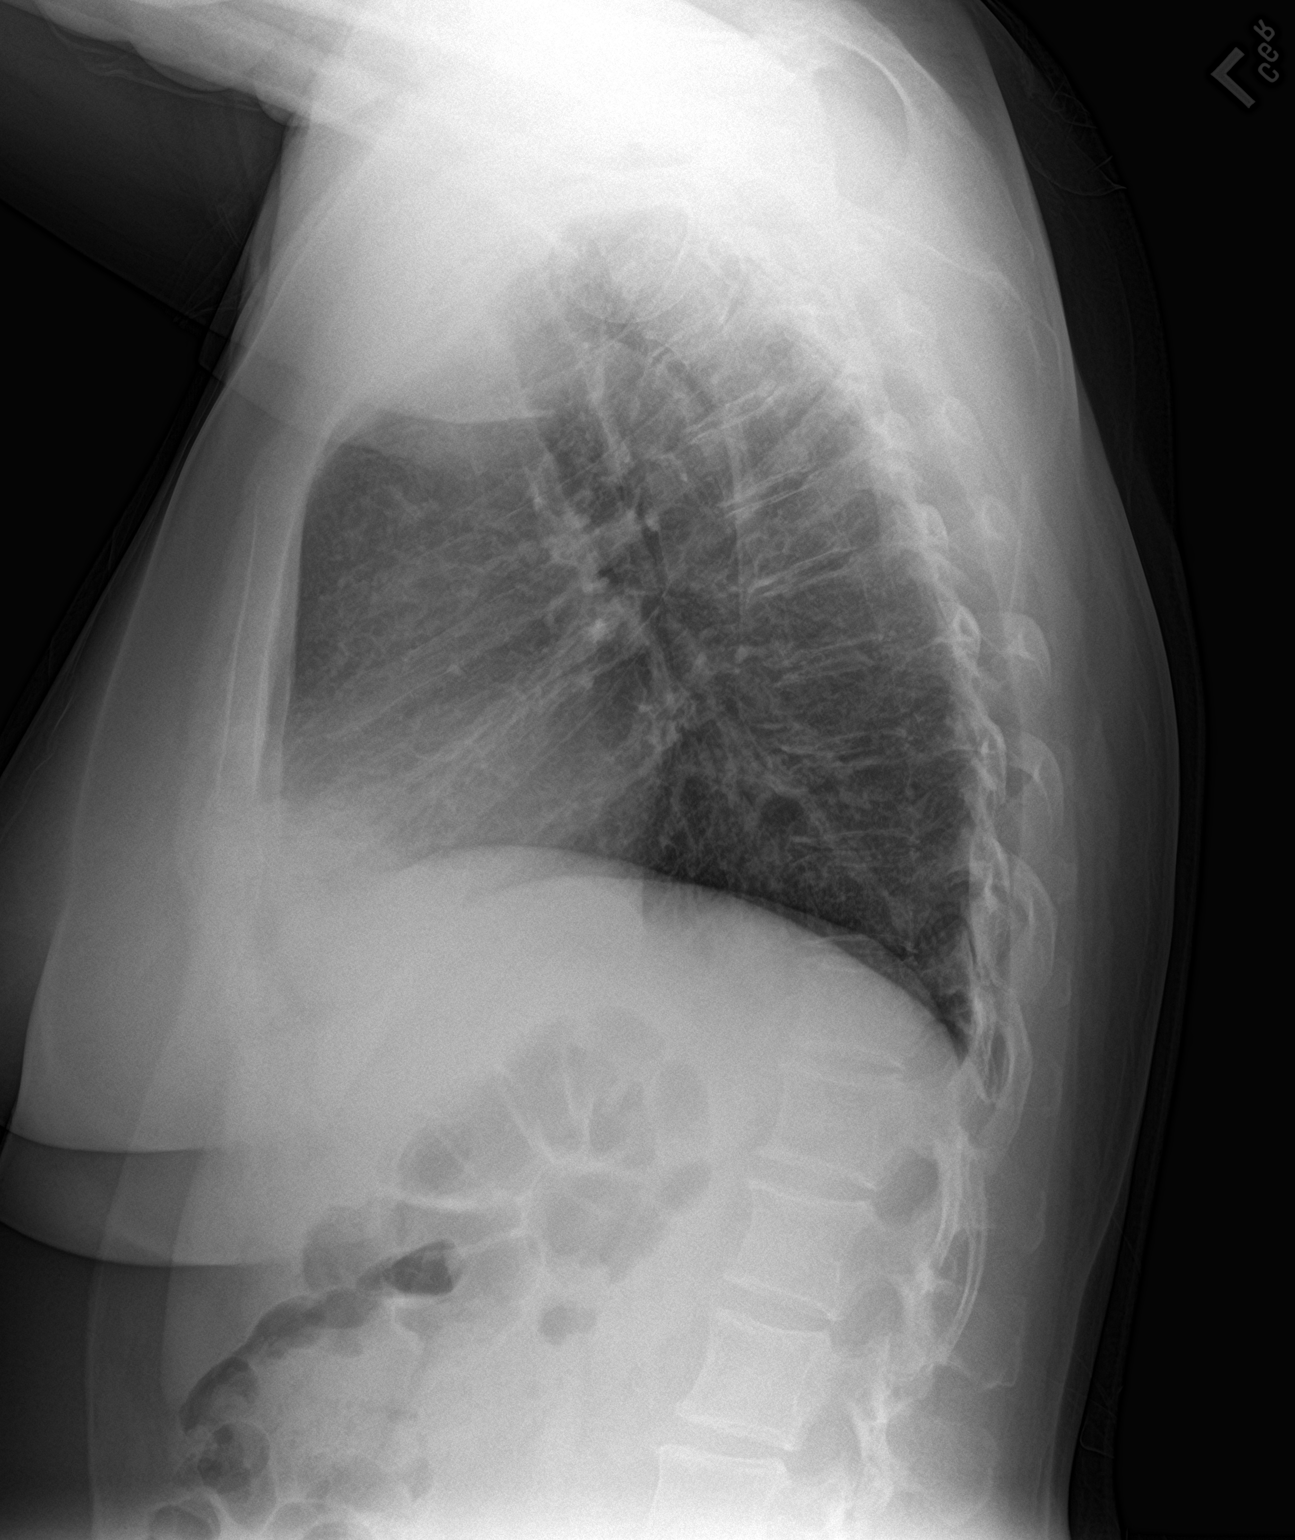

[2 of 2 positions shown; findings below may reference images not displayed]

FINDINGS: The heart size and mediastinal contours are within normal limits.
Both lungs are clear. The visualized skeletal structures are
unremarkable.
IMPRESSION: No active cardiopulmonary disease.

## 2022-05-24 ENCOUNTER — Other Ambulatory Visit: Payer: Self-pay

## 2022-05-24 DIAGNOSIS — Z1231 Encounter for screening mammogram for malignant neoplasm of breast: Secondary | ICD-10-CM

## 2022-08-01 ENCOUNTER — Ambulatory Visit
Admission: RE | Admit: 2022-08-01 | Discharge: 2022-08-01 | Disposition: A | Discharge status: Home / Self Care | Payer: Self-pay | Source: Ambulatory Visit | Attending: Obstetrics and Gynecology | Admitting: Obstetrics and Gynecology

## 2022-08-01 ENCOUNTER — Ambulatory Visit: Payer: Self-pay | Attending: Hematology and Oncology | Admitting: Hematology and Oncology

## 2022-08-01 VITALS — BP 152/96 | Wt 162.2 lb

## 2022-08-01 DIAGNOSIS — Z1231 Encounter for screening mammogram for malignant neoplasm of breast: Secondary | ICD-10-CM | POA: Insufficient documentation

## 2022-08-01 NOTE — Progress Notes (Signed)
Cassandra Webster is a 52 y.o. female who presents to [facility] clinic today with no complaints.    Pap Smear: Pap not smear completed today. Last Pap smear was 02/18/2020 at [facility] clinic and was normal. Per patient has no history of an abnormal Pap smear. Last Pap smear result is available in Epic.   Physical exam: Breasts Breasts symmetrical. No skin abnormalities bilateral breasts. No nipple retraction bilateral breasts. No nipple discharge bilateral breasts. No lymphadenopathy. No lumps palpated bilateral breasts.  MS DIGITAL SCREENING TOMO BILATERAL  Result Date: 07/15/2021 CLINICAL DATA:  Screening. EXAM: DIGITAL SCREENING BILATERAL MAMMOGRAM WITH TOMOSYNTHESIS AND CAD TECHNIQUE: Bilateral screening digital craniocaudal and mediolateral oblique mammograms were obtained. Bilateral screening digital breast tomosynthesis was performed. The images were evaluated with computer-aided detection. COMPARISON:  Previous exam(s). ACR Breast Density Category c: The breast tissue is heterogeneously dense, which may obscure small masses. FINDINGS: There are no findings suspicious for malignancy. IMPRESSION: No mammographic evidence of malignancy. A result letter of this screening mammogram will be mailed directly to the patient. RECOMMENDATION: Screening mammogram in one year. (Code:SM-B-01Y) BI-RADS CATEGORY  1: Negative. Electronically Signed   By: Annia Belt M.D.   On: 07/15/2021 12:04   MS DIGITAL SCREENING TOMO BILATERAL  Result Date: 05/01/2020 CLINICAL DATA:  Screening. EXAM: DIGITAL SCREENING BILATERAL MAMMOGRAM WITH TOMOSYNTHESIS AND CAD TECHNIQUE: Bilateral screening digital craniocaudal and mediolateral oblique mammograms were obtained. Bilateral screening digital breast tomosynthesis was performed. The images were evaluated with computer-aided detection. COMPARISON:  Previous exam(s). ACR Breast Density Category b: There are scattered areas of fibroglandular density. FINDINGS:  There are no findings suspicious for malignancy. The images were evaluated with computer-aided detection. IMPRESSION: No mammographic evidence of malignancy. A result letter of this screening mammogram will be mailed directly to the patient. RECOMMENDATION: Screening mammogram in one year. (Code:SM-B-01Y) BI-RADS CATEGORY  1: Negative. Electronically Signed   By: Meda Klinefelter MD   On: 05/01/2020 16:21         Pelvic/Bimanual Pap is not indicated today    Smoking History: Patient is a never smoker and was not referred to quit line.    Patient Navigation: Patient education provided. Access to services provided for patient through BCCCP program. Delos Haring interpreter provided. No transportation provided   Colorectal Cancer Screening: Per patient has never had colonoscopy completed No complaints today. FIT test due in December.    Breast and Cervical Cancer Risk Assessment: Patient does not have family history of breast cancer, known genetic mutations, or radiation treatment to the chest before age 54. Patient does not have history of cervical dysplasia, immunocompromised, or DES exposure in-utero.  Risk Assessment   No risk assessment data for the current encounter  Risk Scores       07/14/2021   Last edited by: Narda Rutherford, LPN   5-year risk: 0.5%   Lifetime risk: 4.1%            A: BCCCP exam without pap smear No complaints and benign exam.   P: [facility] for a screening mammogram. Appointment scheduled 08/01/22.  Ilda Basset A, NP 08/01/2022 2:12 PM

## 2022-08-01 NOTE — Patient Instructions (Signed)
Taught Cassandra Webster about self breast awareness and gave educational materials to take home. Patient did not need a Pap smear today due to last Pap smear was in 02/18/20 per patient.  Let her know BCCCP will cover Pap smears every 5 years unless has a history of abnormal Pap smears. Referred patient to the [facility] for screening mammogram. Appointment scheduled for 08/01/22. Patient aware of appointment and will be there. Let patient know will follow up with her within the next couple weeks with results. Cassandra Webster verbalized understanding.  Pascal Lux, NP 2:12 PM

## 2023-07-06 ENCOUNTER — Telehealth: Payer: Self-pay | Admitting: *Deleted

## 2023-08-01 ENCOUNTER — Other Ambulatory Visit: Payer: Self-pay | Admitting: Physician Assistant

## 2023-08-01 DIAGNOSIS — Z1231 Encounter for screening mammogram for malignant neoplasm of breast: Secondary | ICD-10-CM

## 2023-08-18 ENCOUNTER — Ambulatory Visit
Admission: RE | Admit: 2023-08-18 | Discharge: 2023-08-18 | Disposition: A | Discharge status: Home / Self Care | Payer: Self-pay | Source: Ambulatory Visit | Attending: Physician Assistant | Admitting: Physician Assistant

## 2023-08-18 DIAGNOSIS — Z1231 Encounter for screening mammogram for malignant neoplasm of breast: Secondary | ICD-10-CM | POA: Insufficient documentation

## 2023-08-23 ENCOUNTER — Other Ambulatory Visit: Payer: Self-pay | Admitting: Obstetrics and Gynecology

## 2023-08-23 ENCOUNTER — Other Ambulatory Visit: Payer: Self-pay

## 2023-08-23 DIAGNOSIS — R928 Other abnormal and inconclusive findings on diagnostic imaging of breast: Secondary | ICD-10-CM

## 2023-09-25 ENCOUNTER — Ambulatory Visit
Admission: RE | Admit: 2023-09-25 | Discharge: 2023-09-25 | Disposition: A | Discharge status: Home / Self Care | Payer: Self-pay | Source: Ambulatory Visit | Attending: Obstetrics and Gynecology | Admitting: Obstetrics and Gynecology

## 2023-09-25 ENCOUNTER — Ambulatory Visit: Payer: Self-pay | Attending: Obstetrics and Gynecology | Admitting: *Deleted

## 2023-09-25 VITALS — BP 135/95 | Ht 61.0 in | Wt 165.1 lb

## 2023-09-25 DIAGNOSIS — Z1239 Encounter for other screening for malignant neoplasm of breast: Secondary | ICD-10-CM

## 2023-09-25 DIAGNOSIS — R928 Other abnormal and inconclusive findings on diagnostic imaging of breast: Secondary | ICD-10-CM | POA: Insufficient documentation

## 2023-09-25 NOTE — Patient Instructions (Signed)
 Explained breast self awareness with Cassandra Webster. Patient did not need a Pap smear today due to last Pap smear and HPV typing was 04/11/2023. Let her know BCCCP will cover Pap smears and HPV typing every 5 years unless has a history of abnormal Pap smears. Referred patient to the [facility] for a right breast diagnostic mammogram per recommendation. Appointment scheduled Monday, September 25, 2023 at 1420. Patient aware of appointment and will be there. Cassandra Webster verbalized understanding.  Kaleisha Bhargava, Wanda Ship, RN 2:03 PM

## 2023-09-25 NOTE — Progress Notes (Signed)
 [name] is a 53 y.o. female who presents to [facility] clinic today with no complaints. Patient referred to BCCCP due to patient had a screening mammogram completed 08/22/2023 that additional imaging of the right breast is recommended for follow up.   Pap Smear: Pap smear not completed today. Last Pap smear was 04/11/2023 at [facility] clinic and was normal with negative HPV. Per patient has no history of an abnormal Pap smear. Last Pap smear result is available in Epic.    Physical exam: Breasts Breasts symmetrical. No skin abnormalities bilateral breasts. No nipple retraction bilateral breasts. No nipple discharge bilateral breasts. No lymphadenopathy. No lumps palpated bilateral breasts. No complaints of pain or tenderness on exam.   MS 3D SCR MAMMO BILAT BR (aka MM) Result Date: 08/22/2023 CLINICAL DATA:  Screening. EXAM: DIGITAL SCREENING BILATERAL MAMMOGRAM WITH TOMOSYNTHESIS AND CAD TECHNIQUE: Bilateral screening digital craniocaudal and mediolateral oblique mammograms were obtained. Bilateral screening digital breast tomosynthesis was performed. The images were evaluated with computer-aided detection. COMPARISON:  Previous exam(s). ACR Breast Density Category b: There are scattered areas of fibroglandular density. FINDINGS: In the right axilla, a possible mass warrants further evaluation. In the left breast, no findings suspicious for malignancy. IMPRESSION: Further evaluation is suggested for possible mass in the right axilla. RECOMMENDATION: Ultrasound of the right axilla. (Code:US -R-22M) The patient will be contacted regarding the findings, and additional imaging will be scheduled. BI-RADS CATEGORY  0: Incomplete. Need additional imaging evaluation and/or prior mammograms for comparison. Electronically Signed   By: Inocente Ast M.D.   On: 08/22/2023 15:26   MS 3D SCR MAMMO BILAT BR (aka MM) Result Date: 08/03/2022 CLINICAL DATA:  Screening. EXAM: DIGITAL SCREENING  BILATERAL MAMMOGRAM WITH TOMOSYNTHESIS AND CAD TECHNIQUE: Bilateral screening digital craniocaudal and mediolateral oblique mammograms were obtained. Bilateral screening digital breast tomosynthesis was performed. The images were evaluated with computer-aided detection. COMPARISON:  Previous exam(s). ACR Breast Density Category b: There are scattered areas of fibroglandular density. FINDINGS: There are no findings suspicious for malignancy. IMPRESSION: No mammographic evidence of malignancy. A result letter of this screening mammogram will be mailed directly to the patient. RECOMMENDATION: Screening mammogram in one year. (Code:SM-B-01Y) BI-RADS CATEGORY  1: Negative. Electronically Signed   By: Delon Music M.D.   On: 08/03/2022 07:35   MS DIGITAL SCREENING TOMO BILATERAL Result Date: 07/15/2021 CLINICAL DATA:  Screening. EXAM: DIGITAL SCREENING BILATERAL MAMMOGRAM WITH TOMOSYNTHESIS AND CAD TECHNIQUE: Bilateral screening digital craniocaudal and mediolateral oblique mammograms were obtained. Bilateral screening digital breast tomosynthesis was performed. The images were evaluated with computer-aided detection. COMPARISON:  Previous exam(s). ACR Breast Density Category c: The breast tissue is heterogeneously dense, which may obscure small masses. FINDINGS: There are no findings suspicious for malignancy. IMPRESSION: No mammographic evidence of malignancy. A result letter of this screening mammogram will be mailed directly to the patient. RECOMMENDATION: Screening mammogram in one year. (Code:SM-B-01Y) BI-RADS CATEGORY  1: Negative. Electronically Signed   By: Bard Moats M.D.   On: 07/15/2021 12:04   MS DIGITAL SCREENING TOMO BILATERAL Result Date: 05/01/2020 CLINICAL DATA:  Screening. EXAM: DIGITAL SCREENING BILATERAL MAMMOGRAM WITH TOMOSYNTHESIS AND CAD TECHNIQUE: Bilateral screening digital craniocaudal and mediolateral oblique mammograms were obtained. Bilateral screening digital breast tomosynthesis  was performed. The images were evaluated with computer-aided detection. COMPARISON:  Previous exam(s). ACR Breast Density Category b: There are scattered areas of fibroglandular density. FINDINGS: There are no findings suspicious for malignancy. The images were evaluated with computer-aided detection. IMPRESSION: No mammographic evidence  of malignancy. A result letter of this screening mammogram will be mailed directly to the patient. RECOMMENDATION: Screening mammogram in one year. (Code:SM-B-01Y) BI-RADS CATEGORY  1: Negative. Electronically Signed   By: Corean Salter MD   On: 05/01/2020 16:21   Pelvic/Bimanual Pap is not indicated today per BCCCP guidelines.   Smoking History: Patient has never smoked.   Patient Navigation: Patient education provided. Access to services provided for patient through [employer] program. Spanish interpreter Damon Pierce from [facility] provided.   Colorectal Cancer Screening: Per patient has never had colonoscopy completed. Patient stated she completed a cologuard test in May 2025 given by her PCP. No complaints today.    Breast and Cervical Cancer Risk Assessment: Patient does not have family history of breast cancer, known genetic mutations, or radiation treatment to the chest before age 70. Patient does not have history of cervical dysplasia, immunocompromised, or DES exposure in-utero.  Risk Scores as of Encounter on 09/25/2023     [place]           5-year 0.72%   Lifetime 5.67%   This patient is [place] but has no documented birth country, so the [employer] model used data from [place] patients to calculate their risk score. Document a birth country in the Demographics activity for a more accurate score.         Last calculated by Rogerio Tempie SQUIBB, LPN on 0/77/7974 at  1:51 PM        A: BCCCP exam without pap smear No complaints.  P: Referred patient to the [facility] for a right breast diagnostic mammogram per recommendation.  Appointment scheduled Monday, September 25, 2023 at 1420.  Driscilla Wanda SQUIBB, RN 09/25/2023 1:51 PM
